# Patient Record
Sex: Female | Born: 2000 | Hispanic: No | Marital: Single | Smoking: Former smoker
Health system: Southern US, Community
[De-identification: ages and names within clinical notes are randomized; demographics above are authoritative.]

## PROBLEM LIST (undated history)

## (undated) DIAGNOSIS — F419 Anxiety disorder, unspecified: Secondary | ICD-10-CM

## (undated) DIAGNOSIS — F319 Bipolar disorder, unspecified: Secondary | ICD-10-CM

## (undated) DIAGNOSIS — F4310 Post-traumatic stress disorder, unspecified: Secondary | ICD-10-CM

## (undated) HISTORY — PX: WISDOM TOOTH EXTRACTION: SHX21

---
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
## 2021-01-14 ENCOUNTER — Other Ambulatory Visit: Payer: Self-pay

## 2021-01-14 ENCOUNTER — Encounter (HOSPITAL_COMMUNITY): Payer: Self-pay | Admitting: Emergency Medicine

## 2021-01-14 ENCOUNTER — Emergency Department (HOSPITAL_COMMUNITY)
Admission: EM | Admit: 2021-01-14 | Discharge: 2021-01-14 | Disposition: A | Discharge status: Home / Self Care | Payer: PRIVATE HEALTH INSURANCE | Attending: Emergency Medicine | Admitting: Emergency Medicine

## 2021-01-14 DIAGNOSIS — Z87891 Personal history of nicotine dependence: Secondary | ICD-10-CM | POA: Insufficient documentation

## 2021-01-14 DIAGNOSIS — W228XXA Striking against or struck by other objects, initial encounter: Secondary | ICD-10-CM | POA: Diagnosis not present

## 2021-01-14 DIAGNOSIS — S61411A Laceration without foreign body of right hand, initial encounter: Secondary | ICD-10-CM | POA: Diagnosis not present

## 2021-01-14 DIAGNOSIS — S6991XA Unspecified injury of right wrist, hand and finger(s), initial encounter: Secondary | ICD-10-CM | POA: Diagnosis present

## 2021-01-14 MED ORDER — AMOXICILLIN-POT CLAVULANATE 875-125 MG PO TABS: 1.0000 | ORAL_TABLET | Freq: Two times a day (BID) | ORAL | 0 refills | Status: DC
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
## 2021-01-14 NOTE — Discharge Instructions (Addendum)
Take antibiotic as prescribed Keep wound clean, apply over the counter antibiotic ointment twice daily Tetanus was updated today Return for signs of infection, increased pain redness warmth pus fevers
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
## 2021-06-20 ENCOUNTER — Encounter (HOSPITAL_COMMUNITY): Payer: Self-pay

## 2021-06-20 ENCOUNTER — Other Ambulatory Visit: Payer: Self-pay

## 2021-06-20 ENCOUNTER — Emergency Department (HOSPITAL_COMMUNITY)
Admission: EM | Admit: 2021-06-20 | Discharge: 2021-06-21 | Disposition: A | Discharge status: Home / Self Care | Payer: Medicaid - Out of State | Attending: Emergency Medicine | Admitting: Emergency Medicine

## 2021-06-20 DIAGNOSIS — S00531A Contusion of lip, initial encounter: Secondary | ICD-10-CM | POA: Insufficient documentation

## 2021-06-20 DIAGNOSIS — Y9289 Other specified places as the place of occurrence of the external cause: Secondary | ICD-10-CM | POA: Diagnosis not present

## 2021-06-20 DIAGNOSIS — W2209XA Striking against other stationary object, initial encounter: Secondary | ICD-10-CM | POA: Insufficient documentation

## 2021-06-20 DIAGNOSIS — S0101XA Laceration without foreign body of scalp, initial encounter: Secondary | ICD-10-CM | POA: Diagnosis not present

## 2021-06-20 DIAGNOSIS — S0012XA Contusion of left eyelid and periocular area, initial encounter: Secondary | ICD-10-CM | POA: Insufficient documentation

## 2021-06-20 DIAGNOSIS — Y9389 Activity, other specified: Secondary | ICD-10-CM | POA: Diagnosis not present

## 2021-06-20 DIAGNOSIS — S0990XA Unspecified injury of head, initial encounter: Secondary | ICD-10-CM | POA: Diagnosis present

## 2021-06-20 DIAGNOSIS — Z23 Encounter for immunization: Secondary | ICD-10-CM | POA: Insufficient documentation

## 2021-06-20 LAB — COMPREHENSIVE METABOLIC PANEL
ALT: 13 U/L (ref 0–44)
AST: 20 U/L (ref 15–41)
Albumin: 4.1 g/dL (ref 3.5–5.0)
Alkaline Phosphatase: 84 U/L (ref 38–126)
Anion gap: 8 (ref 5–15)
BUN: 13 mg/dL (ref 6–20)
CO2: 20 mmol/L — ABNORMAL LOW (ref 22–32)
Calcium: 9 mg/dL (ref 8.9–10.3)
Chloride: 108 mmol/L (ref 98–111)
Creatinine, Ser: 0.7 mg/dL (ref 0.44–1.00)
GFR, Estimated: >60 mL/min (ref >60)
Glucose, Bld: 95 mg/dL (ref 70–99)
Potassium: 3.6 mmol/L (ref 3.5–5.1)
Sodium: 136 mmol/L (ref 135–145)
Total Bilirubin: 0.7 mg/dL (ref 0.3–1.2)
Total Protein: 8.1 g/dL (ref 6.5–8.1)

## 2021-06-20 LAB — CBC WITH DIFFERENTIAL/PLATELET
Abs Immature Granulocytes: 0.05 10*3/uL (ref 0.00–0.07)
Basophils Absolute: 0 10*3/uL (ref 0.0–0.1)
Basophils Relative: 0 %
Eosinophils Absolute: 0.1 10*3/uL (ref 0.0–0.5)
Eosinophils Relative: 0 %
HCT: 39.4 % (ref 36.0–46.0)
Hemoglobin: 12.9 g/dL (ref 12.0–15.0)
Immature Granulocytes: 0 %
Lymphocytes Relative: 21 %
Lymphs Abs: 2.7 10*3/uL (ref 0.7–4.0)
MCH: 26.2 pg (ref 26.0–34.0)
MCHC: 32.7 g/dL (ref 30.0–36.0)
MCV: 80.1 fL (ref 80.0–100.0)
Monocytes Absolute: 0.9 10*3/uL (ref 0.1–1.0)
Monocytes Relative: 7 %
Neutro Abs: 9.1 10*3/uL — ABNORMAL HIGH (ref 1.7–7.7)
Neutrophils Relative %: 72 %
Platelets: 256 10*3/uL (ref 150–400)
RBC: 4.92 MIL/uL (ref 3.87–5.11)
RDW: 14.5 % (ref 11.5–15.5)
WBC: 12.9 10*3/uL — ABNORMAL HIGH (ref 4.0–10.5)
nRBC: 0 % (ref 0.0–0.2)
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
## 2021-06-21 ENCOUNTER — Other Ambulatory Visit: Payer: Self-pay

## 2021-06-21 ENCOUNTER — Emergency Department (HOSPITAL_BASED_OUTPATIENT_CLINIC_OR_DEPARTMENT_OTHER): Payer: Medicaid - Out of State

## 2021-06-21 ENCOUNTER — Encounter (HOSPITAL_BASED_OUTPATIENT_CLINIC_OR_DEPARTMENT_OTHER): Payer: Self-pay | Admitting: Emergency Medicine

## 2021-06-21 ENCOUNTER — Emergency Department (HOSPITAL_BASED_OUTPATIENT_CLINIC_OR_DEPARTMENT_OTHER): Payer: Medicaid - Out of State | Admitting: Radiology

## 2021-06-21 ENCOUNTER — Emergency Department (HOSPITAL_BASED_OUTPATIENT_CLINIC_OR_DEPARTMENT_OTHER)
Admission: EM | Admit: 2021-06-21 | Discharge: 2021-06-21 | Disposition: A | Discharge status: Home / Self Care | Payer: Medicaid - Out of State | Source: Home / Self Care | Attending: Emergency Medicine | Admitting: Emergency Medicine

## 2021-06-21 DIAGNOSIS — S0101XA Laceration without foreign body of scalp, initial encounter: Secondary | ICD-10-CM

## 2021-06-21 DIAGNOSIS — W228XXA Striking against or struck by other objects, initial encounter: Secondary | ICD-10-CM | POA: Insufficient documentation

## 2021-06-21 DIAGNOSIS — Y990 Civilian activity done for income or pay: Secondary | ICD-10-CM | POA: Insufficient documentation

## 2021-06-21 DIAGNOSIS — Z23 Encounter for immunization: Secondary | ICD-10-CM | POA: Insufficient documentation

## 2021-06-21 DIAGNOSIS — G930 Cerebral cysts: Secondary | ICD-10-CM

## 2021-06-21 DIAGNOSIS — S5002XA Contusion of left elbow, initial encounter: Secondary | ICD-10-CM

## 2021-06-21 DIAGNOSIS — Z87891 Personal history of nicotine dependence: Secondary | ICD-10-CM | POA: Insufficient documentation

## 2021-06-21 LAB — HCG, QUANTITATIVE, PREGNANCY: hCG, Beta Chain, Quant, S: <1 m[IU]/mL (ref <5)

## 2021-06-21 MED ORDER — TETANUS-DIPHTH-ACELL PERTUSSIS 5-2.5-18.5 LF-MCG/0.5 IM SUSY
0.5000 mL | PREFILLED_SYRINGE | Freq: Once | INTRAMUSCULAR | Status: AC
  Administered 2021-06-21: 0.5 mL via INTRAMUSCULAR
  Filled 2021-06-21: qty 0.5

## 2021-06-21 NOTE — ED Triage Notes (Addendum)
Reports head injury while working on car . Nausea and vomited last last night . Headache today , alert and oriented x 4 , steady gait  Adds cellulitis left elbow , redness , swelling , painful to touch
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
## 2021-06-21 NOTE — ED Notes (Signed)
No s/s of reaction to injection.  Pt stable for d/c.

## 2021-06-21 NOTE — Discharge Instructions (Addendum)
Keep wounds clean and dry. Follow-up with your primary care provider regarding incidental finding of cyst in your brain today.  If you do not have a primary care provider, you may contact neurosurgery to schedule follow-up.

## 2021-06-22 ENCOUNTER — Emergency Department (HOSPITAL_COMMUNITY): Payer: Medicaid - Out of State

## 2021-06-22 ENCOUNTER — Emergency Department (HOSPITAL_COMMUNITY)
Admission: EM | Admit: 2021-06-22 | Discharge: 2021-06-22 | Disposition: A | Discharge status: Home / Self Care | Payer: Medicaid - Out of State | Attending: Emergency Medicine | Admitting: Emergency Medicine

## 2021-06-22 DIAGNOSIS — Z87891 Personal history of nicotine dependence: Secondary | ICD-10-CM | POA: Diagnosis not present

## 2021-06-22 DIAGNOSIS — S0990XA Unspecified injury of head, initial encounter: Secondary | ICD-10-CM | POA: Diagnosis present

## 2021-06-22 DIAGNOSIS — W01198A Fall on same level from slipping, tripping and stumbling with subsequent striking against other object, initial encounter: Secondary | ICD-10-CM | POA: Insufficient documentation

## 2021-06-22 DIAGNOSIS — S0181XA Laceration without foreign body of other part of head, initial encounter: Secondary | ICD-10-CM | POA: Insufficient documentation

## 2021-06-22 MED ORDER — LIDOCAINE HCL (PF) 1 % IJ SOLN
5.0000 mL | Freq: Once | INTRAMUSCULAR | Status: AC
  Administered 2021-06-22: 5 mL
  Filled 2021-06-22: qty 5

## 2021-06-22 MED ORDER — ACETAMINOPHEN 325 MG PO TABS
650.0000 mg | ORAL_TABLET | Freq: Once | ORAL | Status: AC
  Administered 2021-06-22: 650 mg via ORAL
  Filled 2021-06-22: qty 2

## 2021-06-22 NOTE — ED Provider Notes (Signed)
Emergency Medicine Provider Triage Evaluation Note  Hannah Wilkinson , a 20 y.o. female  was evaluated in triage.  Pt complains of a forehead laceration that occurred 1 hour ago.  Patient was coming out of the shower when she slipped on the wet tile and fell forward striking her head against a broken towel rack.  She reports associated blurred vision initially which is since resolved.  Also complaining of a frontal headache which she rates moderate severity.  No loss of consciousness, focal weakness/numbness in her upper or lower extremities, trouble talking, trouble walking. Also had some nausea after the injury.  Review of Systems  Positive:  Negative: See above   Physical Exam  BP 113/90   Pulse 99   Temp 98.3 F (36.8 C)   Resp 14   LMP 05/31/2021 (Exact Date) Comment: pt denies chance of pregnancy  SpO2 99%  Gen:   Awake, no distress   Resp:  Normal effort  MSK:   Moves extremities without difficulty  Other:  4cm circular laceration to the forehead   Medical Decision Making  Medically screening exam initiated at 12:10 PM.  Appropriate orders placed.  Hannah Wilkinson was informed that the remainder of the evaluation will be completed by another provider, this initial triage assessment does not replace that evaluation, and the importance of remaining in the ED until their evaluation is complete.  Following [place] CT head rule.  Imaging not needed at this time.  Laceration will need to be repaired.   Honor Loh Fleetwood, PA-C 06/22/21 1213    Tegeler, Canary Brim, MD 06/22/21 [phone]
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
## 2021-06-22 NOTE — ED Notes (Signed)
Pt returned to lobby. Registration pulled back onto floor.
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
## 2021-06-22 NOTE — ED Triage Notes (Signed)
Pt reports slipping on water in the shower and falling, striking her head on the towel rack. Has lac to bridge of nose. No LOC. Reports lightheadedness at present. Last tetanus shot yesterday.

## 2021-06-22 NOTE — ED Notes (Signed)
Called pt X2 with no answer

## 2021-06-22 NOTE — Discharge Instructions (Addendum)
Warm soapy water over the wound.  Sutures need to be removed in 5 to 7 days  Follow up with primary care, urgent care or back here in the emergency

## 2021-07-07 ENCOUNTER — Emergency Department (HOSPITAL_COMMUNITY): Payer: Medicaid - Out of State

## 2021-07-07 ENCOUNTER — Other Ambulatory Visit: Payer: Self-pay

## 2021-07-07 ENCOUNTER — Emergency Department (HOSPITAL_COMMUNITY)
Admission: EM | Admit: 2021-07-07 | Discharge: 2021-07-07 | Disposition: A | Discharge status: Home / Self Care | Payer: Medicaid - Out of State | Attending: Emergency Medicine | Admitting: Emergency Medicine

## 2021-07-07 DIAGNOSIS — S70312A Abrasion, left thigh, initial encounter: Secondary | ICD-10-CM | POA: Diagnosis not present

## 2021-07-07 DIAGNOSIS — Z87891 Personal history of nicotine dependence: Secondary | ICD-10-CM | POA: Diagnosis not present

## 2021-07-07 DIAGNOSIS — M79605 Pain in left leg: Secondary | ICD-10-CM

## 2021-07-07 DIAGNOSIS — S60512A Abrasion of left hand, initial encounter: Secondary | ICD-10-CM | POA: Diagnosis not present

## 2021-07-07 DIAGNOSIS — Z20822 Contact with and (suspected) exposure to covid-19: Secondary | ICD-10-CM | POA: Diagnosis not present

## 2021-07-07 DIAGNOSIS — S40812A Abrasion of left upper arm, initial encounter: Secondary | ICD-10-CM | POA: Insufficient documentation

## 2021-07-07 DIAGNOSIS — M546 Pain in thoracic spine: Secondary | ICD-10-CM | POA: Insufficient documentation

## 2021-07-07 DIAGNOSIS — R1032 Left lower quadrant pain: Secondary | ICD-10-CM | POA: Insufficient documentation

## 2021-07-07 DIAGNOSIS — S40811A Abrasion of right upper arm, initial encounter: Secondary | ICD-10-CM | POA: Diagnosis not present

## 2021-07-07 DIAGNOSIS — S60511A Abrasion of right hand, initial encounter: Secondary | ICD-10-CM | POA: Insufficient documentation

## 2021-07-07 DIAGNOSIS — S0081XA Abrasion of other part of head, initial encounter: Secondary | ICD-10-CM | POA: Insufficient documentation

## 2021-07-07 DIAGNOSIS — S0990XA Unspecified injury of head, initial encounter: Secondary | ICD-10-CM

## 2021-07-07 DIAGNOSIS — Y92410 Unspecified street and highway as the place of occurrence of the external cause: Secondary | ICD-10-CM | POA: Diagnosis not present

## 2021-07-07 DIAGNOSIS — Y905 Blood alcohol level of 100-119 mg/100 ml: Secondary | ICD-10-CM | POA: Diagnosis not present

## 2021-07-07 LAB — POC URINE PREG, ED: Preg Test, Ur: NEGATIVE

## 2021-07-07 LAB — COMPREHENSIVE METABOLIC PANEL
ALT: 16 U/L (ref 0–44)
AST: 23 U/L (ref 15–41)
Albumin: 3.3 g/dL — ABNORMAL LOW (ref 3.5–5.0)
Alkaline Phosphatase: 86 U/L (ref 38–126)
Anion gap: 8 (ref 5–15)
BUN: 9 mg/dL (ref 6–20)
CO2: 22 mmol/L (ref 22–32)
Calcium: 8.7 mg/dL — ABNORMAL LOW (ref 8.9–10.3)
Chloride: 107 mmol/L (ref 98–111)
Creatinine, Ser: 0.76 mg/dL (ref 0.44–1.00)
GFR, Estimated: >60 mL/min (ref >60)
Glucose, Bld: 96 mg/dL (ref 70–99)
Potassium: 3.4 mmol/L — ABNORMAL LOW (ref 3.5–5.1)
Sodium: 137 mmol/L (ref 135–145)
Total Bilirubin: 0.3 mg/dL (ref 0.3–1.2)
Total Protein: 6.7 g/dL (ref 6.5–8.1)

## 2021-07-07 LAB — CBC
HCT: 37.6 % (ref 36.0–46.0)
Hemoglobin: 12.1 g/dL (ref 12.0–15.0)
MCH: 25.8 pg — ABNORMAL LOW (ref 26.0–34.0)
MCHC: 32.2 g/dL (ref 30.0–36.0)
MCV: 80.2 fL (ref 80.0–100.0)
Platelets: 253 10*3/uL (ref 150–400)
RBC: 4.69 MIL/uL (ref 3.87–5.11)
RDW: 14.7 % (ref 11.5–15.5)
WBC: 9.7 10*3/uL (ref 4.0–10.5)
nRBC: 0 % (ref 0.0–0.2)

## 2021-07-07 LAB — PROTIME-INR
INR: 0.9 (ref 0.8–1.2)
Prothrombin Time: 12.5 seconds (ref 11.4–15.2)

## 2021-07-07 LAB — SAMPLE TO BLOOD BANK

## 2021-07-07 LAB — RESP PANEL BY RT-PCR (FLU A&B, COVID) ARPGX2
Influenza A by PCR: NEGATIVE
Influenza B by PCR: NEGATIVE
SARS Coronavirus 2 by RT PCR: NEGATIVE

## 2021-07-07 LAB — ETHANOL: Alcohol, Ethyl (B): 114 mg/dL — ABNORMAL HIGH (ref <10)

## 2021-07-07 MED ORDER — ACETAMINOPHEN 500 MG PO TABS
1000.0000 mg | ORAL_TABLET | Freq: Once | ORAL | Status: AC
  Administered 2021-07-07: 1000 mg via ORAL
  Filled 2021-07-07: qty 2

## 2021-07-07 MED ORDER — SODIUM CHLORIDE 0.9 % IV BOLUS
1000.0000 mL | Freq: Once | INTRAVENOUS | Status: AC
  Administered 2021-07-07: 1000 mL via INTRAVENOUS

## 2021-07-07 MED ORDER — IOHEXOL 300 MG/ML  SOLN
80.0000 mL | Freq: Once | INTRAMUSCULAR | Status: AC | PRN
  Administered 2021-07-07: 80 mL via INTRAVENOUS

## 2021-07-07 NOTE — ED Notes (Signed)
Transported to CT 

## 2021-07-07 NOTE — Discharge Instructions (Signed)
You were evaluated in the Emergency Department and after careful evaluation, we did not find any emergent condition requiring admission or further testing in the hospital.  Your exam/testing today was overall reassuring.  Imaging does not show any significant injuries.  Recommend Tylenol or Motrin at home for discomfort.  Please return to the Emergency Department if you experience any worsening of your condition.  Thank you for allowing [place] to be a part of your care.

## 2021-07-07 NOTE — ED Notes (Signed)
Patient transported to CT 
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
## 2022-01-20 ENCOUNTER — Emergency Department: Payer: 59

## 2022-01-20 ENCOUNTER — Emergency Department
Admission: EM | Admit: 2022-01-20 | Discharge: 2022-01-20 | Disposition: A | Discharge status: Home / Self Care | Payer: 59 | Attending: Emergency Medicine | Admitting: Emergency Medicine

## 2022-01-20 ENCOUNTER — Other Ambulatory Visit: Payer: Self-pay

## 2022-01-20 DIAGNOSIS — S0990XA Unspecified injury of head, initial encounter: Secondary | ICD-10-CM

## 2022-01-20 DIAGNOSIS — Z23 Encounter for immunization: Secondary | ICD-10-CM | POA: Insufficient documentation

## 2022-01-20 DIAGNOSIS — S0181XA Laceration without foreign body of other part of head, initial encounter: Secondary | ICD-10-CM

## 2022-01-20 DIAGNOSIS — Y9301 Activity, walking, marching and hiking: Secondary | ICD-10-CM | POA: Insufficient documentation

## 2022-01-20 DIAGNOSIS — W260XXA Contact with knife, initial encounter: Secondary | ICD-10-CM | POA: Insufficient documentation

## 2022-01-20 MED ORDER — LIDOCAINE-EPINEPHRINE 2 %-1:100000 IJ SOLN
20.0000 mL | Freq: Once | INTRAMUSCULAR | Status: AC
  Administered 2022-01-20: 20 mL

## 2022-01-20 MED ORDER — TETANUS-DIPHTH-ACELL PERTUSSIS 5-2.5-18.5 LF-MCG/0.5 IM SUSY
0.5000 mL | PREFILLED_SYRINGE | Freq: Once | INTRAMUSCULAR | Status: AC
  Administered 2022-01-20: 0.5 mL via INTRAMUSCULAR
  Filled 2022-01-20: qty 0.5
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
## 2022-01-20 NOTE — ED Notes (Signed)
Pt sitting up in bed, on cell phone at this time.  Pt denies any needs at this time.  NAD noted.   ?

## 2022-01-20 NOTE — ED Notes (Signed)
Dr. Elesa Massed at bedside suturing laceration to left forehead.   ?

## 2022-01-20 NOTE — Discharge Instructions (Signed)
You may alternate Tylenol 1000 mg every 6 hours as needed for pain, fever and Ibuprofen 800 mg every 6-8 hours as needed for pain, fever.  Please take Ibuprofen with food.  Do not take more than 4000 mg of Tylenol (acetaminophen) in a 24 hour period.  Steps to find a Primary Care Provider (PCP):  Call [phone] or [phone] to access "[facility] Find a Doctor Service."  2.  You may also go on the [facility] website at [email or web address]  

## 2022-01-20 NOTE — ED Notes (Signed)
Pt ambulatory to lobby at this time.  Pt in NAD.  Given DC instructions and pt verbalized understanding.  ?

## 2022-01-29 ENCOUNTER — Other Ambulatory Visit: Payer: Self-pay

## 2022-01-29 ENCOUNTER — Emergency Department: Payer: 59

## 2022-01-29 ENCOUNTER — Encounter: Payer: Self-pay | Admitting: Emergency Medicine

## 2022-01-29 DIAGNOSIS — R0981 Nasal congestion: Secondary | ICD-10-CM | POA: Insufficient documentation

## 2022-01-29 DIAGNOSIS — R059 Cough, unspecified: Secondary | ICD-10-CM | POA: Insufficient documentation

## 2022-01-29 DIAGNOSIS — Z20822 Contact with and (suspected) exposure to covid-19: Secondary | ICD-10-CM | POA: Insufficient documentation

## 2022-01-29 DIAGNOSIS — H6121 Impacted cerumen, right ear: Secondary | ICD-10-CM | POA: Insufficient documentation

## 2022-01-29 DIAGNOSIS — Z5321 Procedure and treatment not carried out due to patient leaving prior to being seen by health care provider: Secondary | ICD-10-CM | POA: Insufficient documentation

## 2022-01-29 DIAGNOSIS — M791 Myalgia, unspecified site: Secondary | ICD-10-CM | POA: Insufficient documentation

## 2022-01-29 DIAGNOSIS — J029 Acute pharyngitis, unspecified: Secondary | ICD-10-CM | POA: Insufficient documentation

## 2022-01-29 LAB — RESP PANEL BY RT-PCR (FLU A&B, COVID) ARPGX2
Influenza A by PCR: NEGATIVE
Influenza B by PCR: NEGATIVE
SARS Coronavirus 2 by RT PCR: NEGATIVE

## 2022-01-29 LAB — GROUP A STREP BY PCR: Group A Strep by PCR: NOT DETECTED

## 2022-01-29 NOTE — ED Provider Triage Note (Signed)
Emergency Medicine Provider Triage Evaluation Note  Hannah Wilkinson , a 21 y.o. female  was evaluated in triage.  Pt complains of body aches, congestion, sore throat, cough times a week..  Review of Systems  Positive: Bodies, congestion, sore throat, cough Negative: Fever, chills, shortness of breath, GI symptoms  Physical Exam  LMP 01/06/2022 (Approximate)  Gen:   Awake, no distress   Resp:  Normal effort  MSK:   Moves extremities without difficulty  Other:  Exudates on tonsils  Medical Decision Making  Medically screening exam initiated at 7:46 PM.  Appropriate orders placed.  Shanya Ferriss was informed that the remainder of the evaluation will be completed by another provider, this initial triage assessment does not replace that evaluation, and the importance of remaining in the ED until their evaluation is complete.  Patient presents to the ED complaining of congestion, cough, body aches times a week.  Will order COVID, flu, strep and chest x-ray.   Racheal Patches, PA-C 01/29/22 1946

## 2022-01-29 NOTE — ED Triage Notes (Signed)
Pt presents via POV with c/o generalized body aches, sore throat and right ear fullness. Pt reports has been going on since last Saturday. Denies any known sick contacts

## 2022-01-30 ENCOUNTER — Encounter: Payer: Self-pay | Admitting: Emergency Medicine

## 2022-01-30 ENCOUNTER — Emergency Department
Admission: EM | Admit: 2022-01-30 | Discharge: 2022-01-30 | Discharge status: Against Medical Advice | Payer: 59 | Attending: Emergency Medicine | Admitting: Emergency Medicine

## 2022-01-30 ENCOUNTER — Ambulatory Visit
Admission: EM | Admit: 2022-01-30 | Discharge: 2022-01-30 | Disposition: A | Discharge status: Home / Self Care | Payer: 59 | Attending: Family Medicine | Admitting: Family Medicine

## 2022-01-30 DIAGNOSIS — Z4802 Encounter for removal of sutures: Secondary | ICD-10-CM

## 2022-01-30 DIAGNOSIS — H9201 Otalgia, right ear: Secondary | ICD-10-CM

## 2022-01-30 DIAGNOSIS — J039 Acute tonsillitis, unspecified: Secondary | ICD-10-CM

## 2022-01-30 LAB — POCT RAPID STREP A (OFFICE): Rapid Strep A Screen: NEGATIVE

## 2022-01-30 MED ORDER — AMOXICILLIN-POT CLAVULANATE 875-125 MG PO TABS: 1.0000 | ORAL_TABLET | Freq: Two times a day (BID) | ORAL | 0 refills | Status: AC

## 2022-01-30 NOTE — ED Notes (Signed)
No answer when called several times from lobby 
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
## 2022-01-30 NOTE — ED Triage Notes (Addendum)
Pt presents with a ST x 1 week and right ear pain. It appears she went to the ED yesterday but left without being seen. She also needs her sutures removed that were placed on 01/20/22 at [facility].

## 2022-04-29 ENCOUNTER — Ambulatory Visit
Admission: EM | Admit: 2022-04-29 | Discharge: 2022-04-29 | Disposition: A | Discharge status: Home / Self Care | Payer: Self-pay

## 2022-04-29 DIAGNOSIS — S61411A Laceration without foreign body of right hand, initial encounter: Secondary | ICD-10-CM
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
## 2022-04-29 NOTE — Discharge Instructions (Signed)
Patient treated for laceration to right hand  Externally as it has been 2 days we will be unable to close the area, on exam your skin is already begun to heal and will continue to do so over the next few days  As you endorsed that you have seen puslike drainage at home we will provide antibiotics to remove any infection  Take doxycycline every morning and every evening for the next 7 days  Your wound has been cleansed here in the urgent care, once home you may cleanse daily with normal soap and water, pat dry and then cover with a Band-Aid for the next 7 days  Follow-up with urgent care as needed for any further concerns

## 2022-05-10 ENCOUNTER — Other Ambulatory Visit: Payer: Self-pay

## 2022-05-10 ENCOUNTER — Emergency Department: Payer: Medicaid - Out of State

## 2022-05-10 ENCOUNTER — Emergency Department
Admission: EM | Admit: 2022-05-10 | Discharge: 2022-05-10 | Disposition: A | Discharge status: Home / Self Care | Payer: Medicaid - Out of State | Attending: Emergency Medicine | Admitting: Emergency Medicine

## 2022-05-10 DIAGNOSIS — S0181XA Laceration without foreign body of other part of head, initial encounter: Secondary | ICD-10-CM

## 2022-05-10 DIAGNOSIS — X58XXXA Exposure to other specified factors, initial encounter: Secondary | ICD-10-CM | POA: Insufficient documentation

## 2022-05-10 DIAGNOSIS — S0993XA Unspecified injury of face, initial encounter: Secondary | ICD-10-CM | POA: Diagnosis present

## 2022-05-10 DIAGNOSIS — S01411A Laceration without foreign body of right cheek and temporomandibular area, initial encounter: Secondary | ICD-10-CM | POA: Diagnosis not present

## 2022-05-10 MED ORDER — LIDOCAINE HCL (PF) 1 % IJ SOLN
INTRAMUSCULAR | Status: AC
  Administered 2022-05-10: 5 mL
  Filled 2022-05-10: qty 5

## 2022-05-10 MED ORDER — CEPHALEXIN 500 MG PO CAPS: 500.0000 mg | ORAL_CAPSULE | Freq: Three times a day (TID) | ORAL | 0 refills | Status: AC

## 2022-05-10 MED ORDER — LIDOCAINE HCL 1 % IJ SOLN: 5.0000 mL | Freq: Once | INTRAMUSCULAR | Status: AC

## 2022-05-10 NOTE — ED Provider Notes (Signed)
[facility] Provider Note  Patient Contact: 10:55 PM (approximate)   History   Laceration   HPI  Hannah Wilkinson is a 21 y.o. female presents to the emergency department with a 6 cm linear right-sided facial laceration that was sustained from working on patient's car.  Patient states that her tetanus status is up-to-date.  Patient denies numbness or tingling in the upper and lower extremities.  No chest pain, chest tightness or abdominal pain.      Physical Exam   Triage Vital Signs: ED Triage Vitals  Enc Vitals Group     BP 05/10/22 2232 128/87     Pulse Rate 05/10/22 2232 93     Resp 05/10/22 2232 18     Temp --      Temp src --      SpO2 05/10/22 2232 98 %     Weight 05/10/22 1958 280 lb (127 kg)     Height 05/10/22 1958 5\' 4"  (1.626 m)     Head Circumference --      Peak Flow --      Pain Score 05/10/22 1958 5     Pain Loc --      Pain Edu? --      Excl. in GC? --     Most recent vital signs: Vitals:   05/10/22 2232  BP: 128/87  Pulse: 93  Resp: 18  SpO2: 98%     General: Alert and in no acute distress. Eyes:  PERRL. EOMI. Head: No acute traumatic findings ENT:      Nose: No congestion/rhinnorhea.      Mouth/Throat: Mucous membranes are moist. Neck: No stridor. No cervical spine tenderness to palpation. Cardiovascular:  Good peripheral perfusion Respiratory: Normal respiratory effort without tachypnea or retractions. Lungs CTAB. Good air entry to the bases with no decreased or absent breath sounds. Gastrointestinal: Bowel sounds 4 quadrants. Soft and nontender to palpation. No guarding or rigidity. No palpable masses. No distention. No CVA tenderness. Musculoskeletal: Full range of motion to all extremities.  Neurologic:  No gross focal neurologic deficits are appreciated.  Skin: Patient has a 6 cm linear facial laceration deep to underlying adipose tissue. Other:   ED Results / Procedures / Treatments   Labs (all labs  ordered are listed, but only abnormal results are displayed) Labs Reviewed - No data to display      RADIOLOGY  I personally viewed and evaluated these images as part of my medical decision making, as well as reviewing the written report by the radiologist.  ED Provider Interpretation: CT head and CT max face unremarkable.   PROCEDURES:  Critical Care performed: No  ..Laceration Repair  Date/Time: 05/10/2022 10:59 PM  Performed by: 05/12/2022, PA-C Authorized by: Orvil Feil, PA-C   Consent:    Consent obtained:  Verbal   Risks discussed:  Infection and pain Universal protocol:    Procedure explained and questions answered to patient or proxy's satisfaction: yes     Patient identity confirmed:  Verbally with patient Anesthesia:    Anesthesia method:  Local infiltration   Local anesthetic:  Lidocaine 1% w/o epi Laceration details:    Location:  Face   Face location:  R cheek   Length (cm):  6   Depth (mm):  5 Pre-procedure details:    Preparation:  Patient was prepped and draped in usual sterile fashion Exploration:    Limited defect created (wound extended): no     Contaminated: no  Treatment:    Area cleansed with:  Povidone-iodine   Amount of cleaning:  Standard   Visualized foreign bodies/material removed: no     Debridement:  None Skin repair:    Repair method:  Sutures   Suture size:  5-0   Suture technique:  Running   Number of sutures:  10 Approximation:    Approximation:  Close Repair type:    Repair type:  Simple Post-procedure details:    Dressing:  Non-adherent dressing    MEDICATIONS ORDERED IN ED: Medications  lidocaine (XYLOCAINE) 1 % (with pres) injection 5 mL (5 mLs Infiltration Given 05/10/22 2100)     IMPRESSION / MDM / ASSESSMENT AND PLAN / ED COURSE  I reviewed the triage vital signs and the nursing notes.                              Assessment and plan Laceration 21 year old female presents to the emergency  department with a 6 cm laceration along right cheek.  Vital signs are reassuring at triage.  On exam, patient was alert, active and nontoxic-appearing.  CT head and CT max face showed no evidence of facial fractures, skull fracture or intracranial bleed.  Laceration was repaired easily without complication.  Recommended suture removal in 5 days.  Patient was discharged with Keflex.  Her tetanus status was up-to-date.  Clinical Course as of 05/10/22 2255  Thu May 10, 2022  2218 CT Head Wo Contrast [JW]    Clinical Course User Index [JW] Orvil Feil, [place]     FINAL CLINICAL IMPRESSION(S) / ED DIAGNOSES   Final diagnoses:  Facial laceration, initial encounter     Rx / DC Orders   ED Discharge Orders          Ordered    cephALEXin (KEFLEX) 500 MG capsule  3 times daily        05/10/22 2220             Note:  This document was prepared using Dragon voice recognition software and may include unintentional dictation errors.   Pia Mau [place], PA-C 05/10/22 1610    Sharyn Creamer, MD 05/11/22 [phone]

## 2022-05-10 NOTE — Discharge Instructions (Addendum)
Take Keflex three times daily for the next seven days.  °Have sutures removed in seven days.  °

## 2022-05-10 NOTE — ED Notes (Signed)
E signature pad not working. Pt educated on discharge instructions and verbalized understanding.  

## 2022-05-10 NOTE — ED Triage Notes (Signed)
Pt arrives with c/o laceration to right side of her face. Bleeding is controlled at this time.

## 2022-05-15 ENCOUNTER — Ambulatory Visit
Admission: EM | Admit: 2022-05-15 | Discharge: 2022-05-15 | Disposition: A | Discharge status: Home / Self Care | Payer: Medicaid - Out of State | Attending: Emergency Medicine | Admitting: Emergency Medicine

## 2022-05-15 DIAGNOSIS — U071 COVID-19: Secondary | ICD-10-CM | POA: Insufficient documentation

## 2022-05-15 DIAGNOSIS — R0981 Nasal congestion: Secondary | ICD-10-CM | POA: Insufficient documentation

## 2022-05-15 LAB — SARS CORONAVIRUS 2 (TAT 6-24 HRS): SARS Coronavirus 2: POSITIVE — AB

## 2022-05-15 NOTE — ED Triage Notes (Signed)
Patient to Urgent Care with complaints of congestion and body aches that started yesterday. Also reports a dry throat.  Reports having a positive covid test at home today.

## 2022-05-15 NOTE — Discharge Instructions (Addendum)
The PCR COVID test is pending.  You should self-quarantine according to the CDC guidelines.      Most people do not need to be re-tested at the end of the quarantine period.    Go to the emergency department if you have high fever, shortness of breath, severe diarrhea, or other concerning symptoms.

## 2022-05-15 NOTE — ED Provider Notes (Signed)
[name]    CSN: [id number] Arrival date & time: 05/15/22  1206      History   Chief Complaint Chief Complaint  Patient presents with   Nasal Congestion    HPI Hannah Wilkinson is a 21 y.o. female.  Patient tested positive for COVID at home today. Symptom onset yesterday; fever, body aches, congestion, sore throat, cough.  No rash, shortness of breath, vomiting, diarrhea, or other symptoms.  Treatment at home with ibuprofen and Mucinex.  Patient requests a PCR COVID test.  No pertinent medical history.  BMI 48.  She denies current pregnancy or breastfeeding.      The history is provided by the patient and medical records.    History reviewed. No pertinent past medical history.  There are no problems to display for this patient.   Past Surgical History:  Procedure Laterality Date   WISDOM TOOTH EXTRACTION      OB History   No obstetric history on file.      Home Medications    Prior to Admission medications   Medication Sig Start Date End Date Taking? Authorizing Provider  cephALEXin (KEFLEX) 500 MG capsule Take 1 capsule (500 mg total) by mouth 3 (three) times daily for 7 days. 05/10/22 05/17/22  Orvil Feil, PA-C    Family History History reviewed. No pertinent family history.  Social History Social History   Tobacco Use   Smoking status: Former   Smokeless tobacco: Former  Building services engineer Use: Every day  Substance Use Topics   Alcohol use: Yes    Comment: social   Drug use: Never     Allergies   Patient has no known allergies.   Review of Systems Review of Systems  Constitutional:  Positive for fever. Negative for chills.  HENT:  Positive for congestion, rhinorrhea and sore throat. Negative for ear pain.   Respiratory:  Positive for cough. Negative for shortness of breath.   Cardiovascular:  Negative for chest pain and palpitations.  Gastrointestinal:  Negative for diarrhea and vomiting.  Skin:  Negative for color change and  rash.  All other systems reviewed and are negative.    Physical Exam Triage Vital Signs ED Triage Vitals  Enc Vitals Group     BP      Pulse      Resp      Temp      Temp src      SpO2      Weight      Height      Head Circumference      Peak Flow      Pain Score      Pain Loc      Pain Edu?      Excl. in GC?    No data found.  Updated Vital Signs BP (!) 141/93   Pulse (!) 104   Temp 97.9 F (36.6 C)   Resp 18   Ht 5\' 4"  (1.626 m)   Wt 280 lb (127 kg)   LMP 05/10/2022   SpO2 97%   BMI 48.06 kg/m   Visual Acuity Right Eye Distance:   Left Eye Distance:   Bilateral Distance:    Right Eye Near:   Left Eye Near:    Bilateral Near:     Physical Exam Vitals and nursing note reviewed.  Constitutional:      General: She is not in acute distress.    Appearance: She is well-developed. She is obese.  She is not ill-appearing.  HENT:     Right Ear: Tympanic membrane normal.     Left Ear: Tympanic membrane normal.     Nose: Nose normal.     Mouth/Throat:     Mouth: Mucous membranes are moist.     Pharynx: Oropharynx is clear.  Cardiovascular:     Rate and Rhythm: Normal rate and regular rhythm.     Heart sounds: Normal heart sounds.  Pulmonary:     Effort: Pulmonary effort is normal. No respiratory distress.     Breath sounds: Normal breath sounds.  Musculoskeletal:     Cervical back: Neck supple.  Skin:    General: Skin is warm and dry.  Neurological:     Mental Status: She is alert.  Psychiatric:        Mood and Affect: Mood normal.        Behavior: Behavior normal.      UC Treatments / Results  Labs (all labs ordered are listed, but only abnormal results are displayed) Labs Reviewed  SARS CORONAVIRUS 2 (TAT 6-24 HRS)  POCT URINE PREGNANCY    EKG   Radiology No results found.  Procedures Procedures (including critical care time)  Medications Ordered in UC Medications - No data to display  Initial Impression / Assessment and Plan /  UC Course  I have reviewed the triage vital signs and the nursing notes.  Pertinent labs & imaging results that were available during my care of the patient were reviewed by me and considered in my medical decision making (see chart for details).    COVID-19.  Patient tested positive for COVID at home today.  Symptom onset yesterday.  BMI 48.  She declines antiviral medication.  Discussed symptomatic care including Tylenol or ibuprofen, rest, hydration.  ED precautions discussed.  Education provided on [employer] guidelines and on viral illness.  Instructed patient to follow up with her PCP if her symptoms are not improving.  She agrees to plan of care.    Final Clinical Impressions(s) / UC Diagnoses   Final diagnoses:  Nasal congestion  COVID-19     Discharge Instructions      The PCR COVID test is pending.  You should self-quarantine according to the CDC guidelines.      Most people do not need to be re-tested at the end of the quarantine period.    Go to the emergency department if you have high fever, shortness of breath, severe diarrhea, or other concerning symptoms.        ED Prescriptions   None    PDMP not reviewed this encounter.   Mickie Bail, NP 05/15/22 1245

## 2022-07-11 ENCOUNTER — Emergency Department
Admission: EM | Admit: 2022-07-11 | Discharge: 2022-07-11 | Disposition: A | Discharge status: Home / Self Care | Payer: Medicaid - Out of State | Attending: Emergency Medicine | Admitting: Emergency Medicine

## 2022-07-11 ENCOUNTER — Emergency Department: Payer: Medicaid - Out of State

## 2022-07-11 ENCOUNTER — Encounter: Payer: Self-pay | Admitting: Emergency Medicine

## 2022-07-11 DIAGNOSIS — W01198A Fall on same level from slipping, tripping and stumbling with subsequent striking against other object, initial encounter: Secondary | ICD-10-CM | POA: Insufficient documentation

## 2022-07-11 DIAGNOSIS — S0181XA Laceration without foreign body of other part of head, initial encounter: Secondary | ICD-10-CM | POA: Diagnosis not present

## 2022-07-11 DIAGNOSIS — S0993XA Unspecified injury of face, initial encounter: Secondary | ICD-10-CM | POA: Diagnosis present

## 2022-07-11 MED ORDER — AMOXICILLIN-POT CLAVULANATE 875-125 MG PO TABS
1.0000 | ORAL_TABLET | Freq: Two times a day (BID) | ORAL | 0 refills | Status: AC
Start: 2022-07-11 — End: 2022-07-18

## 2022-07-11 MED ORDER — LIDOCAINE-EPINEPHRINE 1 %-1:100000 IJ SOLN
10.0000 mL | Freq: Once | INTRAMUSCULAR | Status: AC
  Administered 2022-07-11: 10 mL
  Filled 2022-07-11: qty 1

## 2022-07-11 NOTE — Discharge Instructions (Signed)
Return to the ER for any increasing pain swelling warmth redness or fevers, headache, vision changes, nausea vomiting or any urgent changes in your health.  Keep laceration site clean and dry for 5 days.  In 5 days you can shower and get wet and allow Dermabond to come off on its own.  Take antibiotic as prescribed.

## 2022-07-11 NOTE — ED Notes (Signed)
This RN over hears loud verbal dispute while I am taking care of another patient in the next room.  Upon finishing up with previous patient, this RN into room, asked patient and visitors if everything was ok and to please be courteous of others as we do not need to hear their personal dispute.  Advised that if this continued to be an issue, both visitors would be escorted out.  Patient apologizes and expresses verbal understanding at this time.  Security notified of potential need for assistance.
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
## 2022-07-23 ENCOUNTER — Ambulatory Visit
Admission: EM | Admit: 2022-07-23 | Discharge: 2022-07-23 | Disposition: A | Discharge status: Home / Self Care | Payer: Medicaid - Out of State | Attending: Emergency Medicine | Admitting: Emergency Medicine

## 2022-07-23 DIAGNOSIS — S0181XD Laceration without foreign body of other part of head, subsequent encounter: Secondary | ICD-10-CM | POA: Diagnosis not present

## 2022-07-23 DIAGNOSIS — Z5189 Encounter for other specified aftercare: Secondary | ICD-10-CM

## 2022-07-23 MED ORDER — CEPHALEXIN 500 MG PO CAPS: 500.0000 mg | ORAL_CAPSULE | Freq: Four times a day (QID) | ORAL | 0 refills | Status: DC

## 2022-07-23 NOTE — Discharge Instructions (Addendum)
Take the cephalexin as directed.  Follow up with your primary care provider if your symptoms are not improving.

## 2022-07-23 NOTE — ED Triage Notes (Addendum)
Patient to Urgent Care with request to have the wound on the left side of her face evaluated.   Patient had 6cm laceration present to left cheek- sutures and dermabond applied 07/11/22.

## 2022-07-23 NOTE — ED Provider Notes (Signed)
[name]    CSN: [id number] Arrival date & time: 07/23/22  1832      History   Chief Complaint Chief Complaint  Patient presents with   Wound Check    HPI Hannah Wilkinson is a 21 y.o. female.  Patient presents for a recheck of the wound on her left cheek.  No fever, chills, wound drainage, or other symptoms.  She was seen at [facility] ED on 07/11/2022 for a facial laceration; CT was negative; tetanus up-to-date; wound closed with dissolvable suture and Dermabond; treated prophylactically with Augmentin; Patient did not take the Augmentin.   The history is provided by the patient and medical records.    History reviewed. No pertinent past medical history.  There are no problems to display for this patient.   Past Surgical History:  Procedure Laterality Date   WISDOM TOOTH EXTRACTION      OB History   No obstetric history on file.      Home Medications    Prior to Admission medications   Medication Sig Start Date End Date Taking? Authorizing Provider  cephALEXin (KEFLEX) 500 MG capsule Take 1 capsule (500 mg total) by mouth 4 (four) times daily. 07/23/22  Yes Mickie Bail, NP    Family History History reviewed. No pertinent family history.  Social History Social History   Tobacco Use   Smoking status: Former   Smokeless tobacco: Former  Building services engineer Use: Every day  Substance Use Topics   Alcohol use: Yes    Comment: social   Drug use: Never     Allergies   Patient has no known allergies.   Review of Systems Review of Systems  Constitutional:  Negative for chills and fever.  Skin:  Positive for wound. Negative for color change.  All other systems reviewed and are negative.    Physical Exam Triage Vital Signs ED Triage Vitals  Enc Vitals Group     BP 07/23/22 1913 115/84     Pulse Rate 07/23/22 1912 (!) 113     Resp 07/23/22 1912 18     Temp 07/23/22 1912 98.2 F (36.8 C)     Temp src --      SpO2 07/23/22 1912 96 %      Weight --      Height --      Head Circumference --      Peak Flow --      Pain Score 07/23/22 1910 0     Pain Loc --      Pain Edu? --      Excl. in GC? --    No data found.  Updated Vital Signs BP 115/84   Pulse (!) 113   Temp 98.2 F (36.8 C)   Resp 18   LMP 06/22/2022   SpO2 96%   Visual Acuity Right Eye Distance:   Left Eye Distance:   Bilateral Distance:    Right Eye Near:   Left Eye Near:    Bilateral Near:     Physical Exam Vitals and nursing note reviewed.  Constitutional:      General: She is not in acute distress.    Appearance: She is well-developed. She is not ill-appearing.  HENT:     Mouth/Throat:     Mouth: Mucous membranes are moist.  Cardiovascular:     Rate and Rhythm: Normal rate and regular rhythm.     Heart sounds: Normal heart sounds.  Pulmonary:     Effort:  Pulmonary effort is normal. No respiratory distress.     Breath sounds: Normal breath sounds.  Musculoskeletal:     Cervical back: Neck supple.  Skin:    General: Skin is warm and dry.     Findings: Lesion present.  Neurological:     General: No focal deficit present.     Mental Status: She is alert and oriented to person, place, and time.     Sensory: No sensory deficit.     Motor: No weakness.  Psychiatric:        Mood and Affect: Mood normal.        Behavior: Behavior normal.      UC Treatments / Results  Labs (all labs ordered are listed, but only abnormal results are displayed) Labs Reviewed - No data to display  EKG   Radiology No results found.  Procedures Procedures (including critical care time)  Medications Ordered in UC Medications - No data to display  Initial Impression / Assessment and Plan / UC Course  I have reviewed the triage vital signs and the nursing notes.  Pertinent labs & imaging results that were available during my care of the patient were reviewed by me and considered in my medical decision making (see chart for details).   Wound  recheck, subsequent encounter for facial laceration.  Patient was prescribed Augmentin in the ED but did not take it.  Today with Keflex.  Wound care instructions and signs of infection discussed.  Instructed patient to follow-up with her PCP.  Education provided on laceration care.  Patient agrees plan of care.  Final Clinical Impressions(s) / UC Diagnoses   Final diagnoses:  Encounter for wound re-check  Facial laceration, subsequent encounter     Discharge Instructions      Take the cephalexin as directed.  Follow up with your primary care provider if your symptoms are not improving.        ED Prescriptions     Medication Sig Dispense Auth. Provider   cephALEXin (KEFLEX) 500 MG capsule Take 1 capsule (500 mg total) by mouth 4 (four) times daily. 28 capsule Mickie Bail, NP      PDMP not reviewed this encounter.   Mickie Bail, NP 07/23/22 1942
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
## 2023-04-03 ENCOUNTER — Emergency Department (HOSPITAL_COMMUNITY)
Admission: EM | Admit: 2023-04-03 | Discharge: 2023-04-04 | Discharge status: Against Medical Advice | Payer: MEDICAID | Attending: Emergency Medicine | Admitting: Emergency Medicine

## 2023-04-03 ENCOUNTER — Emergency Department (HOSPITAL_COMMUNITY): Payer: MEDICAID

## 2023-04-03 DIAGNOSIS — Z5321 Procedure and treatment not carried out due to patient leaving prior to being seen by health care provider: Secondary | ICD-10-CM | POA: Insufficient documentation

## 2023-04-03 DIAGNOSIS — S61552A Open bite of left wrist, initial encounter: Secondary | ICD-10-CM | POA: Insufficient documentation

## 2023-04-03 DIAGNOSIS — W540XXA Bitten by dog, initial encounter: Secondary | ICD-10-CM | POA: Diagnosis not present
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
## 2023-04-04 NOTE — ED Notes (Signed)
Pt called for in ED lobby for room assignment- no answer x1. ENMiles 

## 2023-06-11 ENCOUNTER — Emergency Department (HOSPITAL_COMMUNITY)
Admission: EM | Admit: 2023-06-11 | Discharge: 2023-06-11 | Disposition: A | Discharge status: Home / Self Care | Payer: MEDICAID | Attending: Emergency Medicine | Admitting: Emergency Medicine

## 2023-06-11 ENCOUNTER — Emergency Department (HOSPITAL_COMMUNITY)
Admission: EM | Admit: 2023-06-11 | Discharge: 2023-06-11 | Disposition: A | Discharge status: Home / Self Care | Payer: MEDICAID | Source: Home / Self Care | Attending: Emergency Medicine | Admitting: Emergency Medicine

## 2023-06-11 ENCOUNTER — Emergency Department (HOSPITAL_COMMUNITY): Payer: MEDICAID

## 2023-06-11 DIAGNOSIS — S060X0D Concussion without loss of consciousness, subsequent encounter: Secondary | ICD-10-CM | POA: Insufficient documentation

## 2023-06-11 DIAGNOSIS — S0990XA Unspecified injury of head, initial encounter: Secondary | ICD-10-CM | POA: Diagnosis present

## 2023-06-11 DIAGNOSIS — S060X0A Concussion without loss of consciousness, initial encounter: Secondary | ICD-10-CM | POA: Diagnosis not present

## 2023-06-11 DIAGNOSIS — W228XXA Striking against or struck by other objects, initial encounter: Secondary | ICD-10-CM | POA: Insufficient documentation

## 2023-06-11 MED ORDER — ONDANSETRON 4 MG PO TBDP
4.0000 mg | ORAL_TABLET | Freq: Once | ORAL | Status: AC
  Administered 2023-06-11: 4 mg via ORAL
  Filled 2023-06-11: qty 1

## 2023-06-11 MED ORDER — ACETAMINOPHEN 500 MG PO TABS
500.0000 mg | ORAL_TABLET | Freq: Four times a day (QID) | ORAL | 0 refills | Status: AC | PRN
Start: 2023-06-11 — End: ?

## 2023-06-11 MED ORDER — ONDANSETRON HCL 4 MG PO TABS
4.0000 mg | ORAL_TABLET | Freq: Three times a day (TID) | ORAL | 0 refills | Status: AC | PRN
Start: 2023-06-11 — End: ?

## 2023-06-11 MED ORDER — IBUPROFEN 800 MG PO TABS: 800.0000 mg | ORAL_TABLET | Freq: Three times a day (TID) | ORAL | 0 refills | Status: AC

## 2023-06-11 MED ORDER — KETOROLAC TROMETHAMINE 30 MG/ML IJ SOLN
30.0000 mg | Freq: Once | INTRAMUSCULAR | Status: AC
  Administered 2023-06-11: 30 mg via INTRAMUSCULAR
  Filled 2023-06-11: qty 1

## 2023-06-11 MED ORDER — ACETAMINOPHEN 500 MG PO TABS
1000.0000 mg | ORAL_TABLET | Freq: Once | ORAL | Status: AC
  Administered 2023-06-11: 1000 mg via ORAL
  Filled 2023-06-11: qty 2
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
## 2023-12-09 ENCOUNTER — Ambulatory Visit
Admission: EM | Admit: 2023-12-09 | Discharge: 2023-12-09 | Disposition: A | Discharge status: Home / Self Care | Payer: Self-pay | Attending: Emergency Medicine | Admitting: Emergency Medicine

## 2023-12-09 DIAGNOSIS — S0181XA Laceration without foreign body of other part of head, initial encounter: Secondary | ICD-10-CM

## 2023-12-09 MED ORDER — CEPHALEXIN 500 MG PO CAPS
500.0000 mg | ORAL_CAPSULE | Freq: Three times a day (TID) | ORAL | 0 refills | Status: AC
Start: 2023-12-09 — End: ?

## 2023-12-09 NOTE — Discharge Instructions (Addendum)
 Your stitches need to be removed in 7 days.    Take the antibiotic as directed.    Keep your wound clean and dry.  Wash it gently twice a day with soap and water.    Return right away if you note signs of infection such as pus, fever, redness.

## 2023-12-09 NOTE — ED Triage Notes (Addendum)
 Patient to Urgent Care with complaints of a facial (right cheek) laceration that occurred this afternoon.   Provided with guaze to control the bleeding. Instructed to continue to hold pressure.   TDAP 01/20/22.

## 2023-12-09 NOTE — ED Provider Notes (Signed)
 [name]    CSN: [id number] Arrival date & time: 12/09/23  1704      History   Chief Complaint Chief Complaint  Patient presents with   Laceration    HPI Kimberl Vig is a 23 y.o. female.  Patient presents with a laceration on her right facial cheek that occurred this afternoon when she accidentally cut her face.  Bleeding controlled with direct pressure.  No numbness or weakness.  Last tetanus 2023.  The history is provided by the patient and medical records.    History reviewed. No pertinent past medical history.  There are no active problems to display for this patient.   Past Surgical History:  Procedure Laterality Date   WISDOM TOOTH EXTRACTION      OB History   No obstetric history on file.      Home Medications    Prior to Admission medications   Medication Sig Start Date End Date Taking? Authorizing Provider  cephALEXin (KEFLEX) 500 MG capsule Take 1 capsule (500 mg total) by mouth 3 (three) times daily. 12/09/23  Yes Mickie Bail, NP  acetaminophen (TYLENOL) 500 MG tablet Take 1 tablet (500 mg total) by mouth every 6 (six) hours as needed. 06/11/23   Fayrene Helper, PA-C  ibuprofen (ADVIL) 800 MG tablet Take 1 tablet (800 mg total) by mouth 3 (three) times daily. 06/11/23   Roemhildt, Lorin T, PA-C  ondansetron (ZOFRAN) 4 MG tablet Take 1 tablet (4 mg total) by mouth every 8 (eight) hours as needed for nausea or vomiting. 06/11/23   Fayrene Helper, PA-C    Family History History reviewed. No pertinent family history.  Social History Social History   Tobacco Use   Smoking status: Former   Smokeless tobacco: Former  Building services engineer status: Every Day  Substance Use Topics   Alcohol use: Yes    Comment: social   Drug use: Never     Allergies   Patient has no known allergies.   Review of Systems Review of Systems  Constitutional:  Negative for chills and fever.  Skin:  Positive for wound. Negative for color change.  Neurological:   Negative for weakness and numbness.     Physical Exam Triage Vital Signs ED Triage Vitals [12/09/23 1736]  Encounter Vitals Group     BP 129/78     Systolic BP Percentile      Diastolic BP Percentile      Pulse Rate 89     Resp 18     Temp 98.2 F (36.8 C)     Temp src      SpO2 98 %     Weight      Height      Head Circumference      Peak Flow      Pain Score      Pain Loc      Pain Education      Exclude from Growth Chart    No data found.  Updated Vital Signs BP 129/78   Pulse 89   Temp 98.2 F (36.8 C)   Resp 18   SpO2 98%   Visual Acuity Right Eye Distance:   Left Eye Distance:   Bilateral Distance:    Right Eye Near:   Left Eye Near:    Bilateral Near:     Physical Exam Constitutional:      General: She is not in acute distress. HENT:     Mouth/Throat:  Mouth: Mucous membranes are moist.  Cardiovascular:     Rate and Rhythm: Normal rate and regular rhythm.  Pulmonary:     Effort: Pulmonary effort is normal. No respiratory distress.  Skin:    General: Skin is warm and dry.     Findings: Lesion present.     Comments: 3 cm linear laceration on right facial cheek.  Bleeding controlled.  Neurological:     General: No focal deficit present.     Mental Status: She is alert.     Sensory: No sensory deficit.     Motor: No weakness.      UC Treatments / Results  Labs (all labs ordered are listed, but only abnormal results are displayed) Labs Reviewed - No data to display  EKG   Radiology No results found.  Procedures Laceration Repair  Date/Time: 12/09/2023 6:02 PM  Performed by: Mickie Bail, NP Authorized by: Mickie Bail, NP   Consent:    Consent obtained:  Verbal   Consent given by:  Patient   Risks discussed:  Infection, pain, poor cosmetic result and poor wound healing Universal protocol:    Procedure explained and questions answered to patient or proxy's satisfaction: yes   Anesthesia:    Anesthesia method:  Local  infiltration   Local anesthetic:  Lidocaine 1% w/o epi Laceration details:    Location:  Face   Face location:  R cheek   Length (cm):  3   Depth (mm):  2 Pre-procedure details:    Preparation:  Patient was prepped and draped in usual sterile fashion Exploration:    Hemostasis achieved with:  Direct pressure   Imaging outcome: foreign body not noted     Wound exploration: wound explored through full range of motion and entire depth of wound visualized   Treatment:    Area cleansed with:  Povidone-iodine   Amount of cleaning:  Standard   Irrigation solution:  Sterile water   Irrigation method:  Syringe   Visualized foreign bodies/material removed: no   Skin repair:    Repair method:  Sutures   Suture size:  5-0   Suture material:  Prolene   Suture technique:  Simple interrupted   Number of sutures:  3 Approximation:    Approximation:  Close Repair type:    Repair type:  Simple Post-procedure details:    Dressing:  Antibiotic ointment and non-adherent dressing   Procedure completion:  Tolerated well, no immediate complications  (including critical care time)  Medications Ordered in UC Medications - No data to display  Initial Impression / Assessment and Plan / UC Course  I have reviewed the triage vital signs and the nursing notes.  Pertinent labs & imaging results that were available during my care of the patient were reviewed by me and considered in my medical decision making (see chart for details).    Laceration of right cheek.  3 sutures.  Tetanus is up-to-date.  Treating prophylactically with cephalexin.  Wound care instructions and signs of infection discussed.  Education provided on laceration care.  Instructed patient to return here in 7 days for suture removal.  Instructed her to return right away if she notes signs of infection.  She agrees to plan of care.  Final Clinical Impressions(s) / UC Diagnoses   Final diagnoses:  Facial laceration, initial encounter      Discharge Instructions      Your stitches need to be removed in 7 days.    Take the antibiotic as  directed.    Keep your wound clean and dry.  Wash it gently twice a day with soap and water.    Return right away if you note signs of infection such as pus, fever, redness.     ED Prescriptions     Medication Sig Dispense Auth. Provider   cephALEXin (KEFLEX) 500 MG capsule Take 1 capsule (500 mg total) by mouth 3 (three) times daily. 28 capsule Mickie Bail, NP      PDMP not reviewed this encounter.   Mickie Bail, NP 12/09/23 [phone]

## 2024-01-30 ENCOUNTER — Other Ambulatory Visit: Payer: Self-pay

## 2024-01-30 ENCOUNTER — Emergency Department (HOSPITAL_COMMUNITY)
Admission: EM | Admit: 2024-01-30 | Discharge: 2024-01-30 | Disposition: A | Discharge status: Home / Self Care | Payer: Self-pay | Attending: Emergency Medicine | Admitting: Emergency Medicine

## 2024-01-30 ENCOUNTER — Encounter (HOSPITAL_COMMUNITY): Payer: Self-pay

## 2024-01-30 DIAGNOSIS — F419 Anxiety disorder, unspecified: Secondary | ICD-10-CM | POA: Insufficient documentation

## 2024-01-30 HISTORY — DX: Anxiety disorder, unspecified: F41.9

## 2024-01-30 HISTORY — DX: Bipolar disorder, unspecified: F31.9

## 2024-01-30 HISTORY — DX: Post-traumatic stress disorder, unspecified: F43.10

## 2024-01-30 NOTE — ED Triage Notes (Signed)
 [facility] reports pt coming Extended stay. Pt having a few stresses in life, states her heart rate was fast. Pt states she is going to jail and on probation. Pt thinks she is having an anxiety attack.

## 2024-01-30 NOTE — ED Provider Notes (Signed)
 [place] EMERGENCY DEPARTMENT AT [facility] Provider Note   CSN: [id number] Arrival date & time: 01/30/24  1417     History  Chief Complaint  Patient presents with   Anxiety    Hannah Wilkinson is a 23 y.o. female presents today for anxiety.  Patient states that she is having stressors in her life and having periodic anxiety attacks.  Patient denies SI/HI/AVH.  Patient reports she is just wanting to find a way to control the symptoms.  Patient denies chest pain, shortness of breath, fever, chills, nausea, vomiting, any other complaints at this time.   Anxiety       Home Medications Prior to Admission medications   Medication Sig Start Date End Date Taking? Authorizing Provider  acetaminophen  (TYLENOL ) 500 MG tablet Take 1 tablet (500 mg total) by mouth every 6 (six) hours as needed. 06/11/23   Debbra Fairy, PA-C  cephALEXin  (KEFLEX ) 500 MG capsule Take 1 capsule (500 mg total) by mouth 3 (three) times daily. 12/09/23   Wellington Half, NP  ibuprofen  (ADVIL ) 800 MG tablet Take 1 tablet (800 mg total) by mouth 3 (three) times daily. 06/11/23   Roemhildt, Lorin T, PA-C  ondansetron  (ZOFRAN ) 4 MG tablet Take 1 tablet (4 mg total) by mouth every 8 (eight) hours as needed for nausea or vomiting. 06/11/23   Debbra Fairy, PA-C      Allergies    Patient has no known allergies.    Review of Systems   Review of Systems  Psychiatric/Behavioral:  The patient is nervous/anxious.     Physical Exam Updated Vital Signs BP 131/85 (BP Location: Left Arm)   Pulse (!) 107   Temp 98.9 F (37.2 C) (Oral)   Resp 16   SpO2 100%  Physical Exam Vitals and nursing note reviewed.  Constitutional:      General: She is not in acute distress.    Appearance: Normal appearance. She is well-developed. She is not ill-appearing, toxic-appearing or diaphoretic.  HENT:     Head: Normocephalic and atraumatic.     Right Ear: External ear normal.     Left Ear: External ear normal.  Eyes:      Conjunctiva/sclera: Conjunctivae normal.  Cardiovascular:     Rate and Rhythm: Normal rate and regular rhythm.     Pulses: Normal pulses.     Heart sounds: Normal heart sounds. No murmur heard. Pulmonary:     Effort: Pulmonary effort is normal. No respiratory distress.     Breath sounds: Normal breath sounds.  Abdominal:     Palpations: Abdomen is soft.     Tenderness: There is no abdominal tenderness.  Musculoskeletal:        General: No swelling.     Cervical back: Neck supple.  Skin:    General: Skin is warm and dry.     Capillary Refill: Capillary refill takes less than 2 seconds.  Neurological:     General: No focal deficit present.     Mental Status: She is alert.  Psychiatric:        Attention and Perception: Attention and perception normal.        Mood and Affect: Mood and affect normal.        Speech: Speech normal.        Behavior: Behavior normal. Behavior is cooperative.        Thought Content: Thought content normal.        Judgment: Judgment normal.     ED Results /  Procedures / Treatments   Labs (all labs ordered are listed, but only abnormal results are displayed) Labs Reviewed - No data to display  EKG None  Radiology No results found.  Procedures Procedures    Medications Ordered in ED Medications - No data to display  ED Course/ Medical Decision Making/ A&P                                 Medical Decision Making  This patient presents to the ED for concern of anxiety, this involves an extensive number of treatment options, and is a complaint that carries with it a high risk of complications and morbidity.  The differential diagnosis includes HI, SI, auditory hallucinations, visual hallucinations   Co morbidities that complicate the patient evaluation  Anxiety and PTSD   Test / Admission - Considered:  Considered for admission or further workup however patient vital signs and physical exam were reassuring.  Given that the patient is  not currently homicidal, suicidal, or hallucinating patient instructed to follow-up with [facility] behavioral health for further evaluation and treatment.  Patient given return precautions.  I feel patient is safe for discharge at this time.        Final Clinical Impression(s) / ED Diagnoses Final diagnoses:  Anxiety    Rx / DC Orders ED Discharge Orders     None         Carie Charity, PA-C 01/30/24 1600    Arvilla Birmingham, MD 01/30/24 2330

## 2024-01-30 NOTE — Discharge Instructions (Addendum)
 Today you were seen for anxiety.  Please go to the [facility] as soon as possible for further evaluation and workup.  Please return to the ED if you have any thoughts of hurting yourself or others or hearing and seeing things other people do not. Thank you for letting us  treat you today. After performing a physical exam, I feel you are safe to go home. Please follow up with your PCP in the next several days and provide them with your records from this visit. Return to the Emergency Room if pain becomes severe or symptoms worsen.

## 2024-01-31 ENCOUNTER — Emergency Department (HOSPITAL_COMMUNITY)
Admission: EM | Admit: 2024-01-31 | Discharge: 2024-01-31 | Disposition: A | Discharge status: Home / Self Care | Payer: Self-pay | Attending: Emergency Medicine | Admitting: Emergency Medicine

## 2024-01-31 ENCOUNTER — Other Ambulatory Visit: Payer: Self-pay

## 2024-01-31 ENCOUNTER — Encounter (HOSPITAL_COMMUNITY): Payer: Self-pay

## 2024-01-31 ENCOUNTER — Emergency Department (HOSPITAL_COMMUNITY): Payer: Self-pay

## 2024-01-31 DIAGNOSIS — R1011 Right upper quadrant pain: Secondary | ICD-10-CM | POA: Insufficient documentation

## 2024-01-31 LAB — COMPREHENSIVE METABOLIC PANEL WITH GFR
ALT: 19 U/L (ref 0–44)
AST: 26 U/L (ref 15–41)
Albumin: 3.6 g/dL (ref 3.5–5.0)
Alkaline Phosphatase: 89 U/L (ref 38–126)
Anion gap: 10 (ref 5–15)
BUN: 10 mg/dL (ref 6–20)
CO2: 25 mmol/L (ref 22–32)
Calcium: 8.5 mg/dL — ABNORMAL LOW (ref 8.9–10.3)
Chloride: 102 mmol/L (ref 98–111)
Creatinine, Ser: 0.97 mg/dL (ref 0.44–1.00)
GFR, Estimated: >60 mL/min (ref >60)
Glucose, Bld: 136 mg/dL — ABNORMAL HIGH (ref 70–99)
Potassium: 3.6 mmol/L (ref 3.5–5.1)
Sodium: 137 mmol/L (ref 135–145)
Total Bilirubin: 0.9 mg/dL (ref 0.0–1.2)
Total Protein: 7.7 g/dL (ref 6.5–8.1)

## 2024-01-31 LAB — CBC WITH DIFFERENTIAL/PLATELET
Abs Immature Granulocytes: 0.06 10*3/uL (ref 0.00–0.07)
Basophils Absolute: 0 10*3/uL (ref 0.0–0.1)
Basophils Relative: 0 %
Eosinophils Absolute: 0 10*3/uL (ref 0.0–0.5)
Eosinophils Relative: 0 %
HCT: 41.7 % (ref 36.0–46.0)
Hemoglobin: 13.4 g/dL (ref 12.0–15.0)
Immature Granulocytes: 1 %
Lymphocytes Relative: 18 %
Lymphs Abs: 2 10*3/uL (ref 0.7–4.0)
MCH: 25.7 pg — ABNORMAL LOW (ref 26.0–34.0)
MCHC: 32.1 g/dL (ref 30.0–36.0)
MCV: 80 fL (ref 80.0–100.0)
Monocytes Absolute: 0.6 10*3/uL (ref 0.1–1.0)
Monocytes Relative: 5 %
Neutro Abs: 8.8 10*3/uL — ABNORMAL HIGH (ref 1.7–7.7)
Neutrophils Relative %: 76 %
Platelets: 259 10*3/uL (ref 150–400)
RBC: 5.21 MIL/uL — ABNORMAL HIGH (ref 3.87–5.11)
RDW: 14.9 % (ref 11.5–15.5)
WBC: 11.6 10*3/uL — ABNORMAL HIGH (ref 4.0–10.5)
nRBC: 0 % (ref 0.0–0.2)

## 2024-01-31 LAB — HCG, SERUM, QUALITATIVE: Preg, Serum: NEGATIVE

## 2024-01-31 LAB — LIPASE, BLOOD: Lipase: 22 U/L (ref 11–51)

## 2024-01-31 MED ORDER — KETOROLAC TROMETHAMINE 30 MG/ML IJ SOLN
30.0000 mg | Freq: Once | INTRAMUSCULAR | Status: AC
  Administered 2024-01-31: 30 mg via INTRAVENOUS
  Filled 2024-01-31: qty 1

## 2024-01-31 MED ORDER — ONDANSETRON HCL 4 MG/2ML IJ SOLN
4.0000 mg | Freq: Once | INTRAMUSCULAR | Status: AC
  Administered 2024-01-31: 4 mg via INTRAVENOUS
  Filled 2024-01-31: qty 2

## 2024-01-31 NOTE — ED Provider Notes (Signed)
 The patient was received in signout from Dr. Monnie Anthony.  She has been reassessed and remains pain-free for multiple hours in the ED.  There were no emergent findings on her workup at this time.  I reexamined her right upper quadrant and abdomen based on the CT findings but I do not see evidence of trauma, nor history of this, no evidence of cellulitis or infection or tenderness.  I suspect this is an incidental finding.  She has not provided a urine sample yet but a very low suspicion for UTI at this point with his clinical presentation.  I think she is stable for discharge.  She is comfortable going home   Hannah Birmingham, MD 01/31/24 1945

## 2024-01-31 NOTE — ED Triage Notes (Signed)
 Pt BIB [facility] from home with c/o lower abdominal pain that began 30 minutes ago. Pt reports had a BM then pain began. Pt reports has been menstruating for 2 weeks and pain radiates to her vagina. VSS per EMS.

## 2024-01-31 NOTE — ED Notes (Signed)
 Pt refused BP prior to discharge

## 2024-01-31 NOTE — ED Provider Notes (Signed)
 [place] EMERGENCY DEPARTMENT AT [facility] Provider Note   CSN: [id number] Arrival date & time: 01/31/24  1309     History  Chief Complaint  Patient presents with   Abdominal Pain    Hannah Wilkinson is a 23 y.o. female.   Abdominal Pain    Patient presents to the ED with complaints of abdominal pain.  Patient states she had sudden onset symptoms about 30 minutes ago.  Patient states pain started after having BM.  The pain primarily in her lower abdomen in the superpubic region.  Patient states she is feeling diaphoretic and nauseous.  She denies any dysuria.  She has been having persistent menstrual bleeding for the last 2 weeks so though it is easing off now.  Home Medications Prior to Admission medications   Medication Sig Start Date End Date Taking? Authorizing Provider  acetaminophen  (TYLENOL ) 500 MG tablet Take 1 tablet (500 mg total) by mouth every 6 (six) hours as needed. 06/11/23   Debbra Fairy, PA-C  cephALEXin  (KEFLEX ) 500 MG capsule Take 1 capsule (500 mg total) by mouth 3 (three) times daily. 12/09/23   Wellington Half, NP  ibuprofen  (ADVIL ) 800 MG tablet Take 1 tablet (800 mg total) by mouth 3 (three) times daily. 06/11/23   Roemhildt, Lorin T, PA-C  ondansetron  (ZOFRAN ) 4 MG tablet Take 1 tablet (4 mg total) by mouth every 8 (eight) hours as needed for nausea or vomiting. 06/11/23   Debbra Fairy, PA-C      Allergies    Patient has no known allergies.    Review of Systems   Review of Systems  Gastrointestinal:  Positive for abdominal pain.    Physical Exam Updated Vital Signs BP (!) 135/101 (BP Location: Left Arm)   Pulse 61   Temp 98 F (36.7 C) (Oral)   Resp 20   Ht 1.626 m (5\' 4" )   Wt 117.9 kg   SpO2 100%   BMI 44.63 kg/m  Physical Exam Vitals and nursing note reviewed.  Constitutional:      General: She is in acute distress.     Appearance: She is well-developed.  HENT:     Head: Normocephalic and atraumatic.     Right Ear: External ear  normal.     Left Ear: External ear normal.  Eyes:     General: No scleral icterus.       Right eye: No discharge.        Left eye: No discharge.     Conjunctiva/sclera: Conjunctivae normal.  Neck:     Trachea: No tracheal deviation.  Cardiovascular:     Rate and Rhythm: Normal rate and regular rhythm.  Pulmonary:     Effort: Pulmonary effort is normal. No respiratory distress.     Breath sounds: Normal breath sounds. No stridor. No wheezing or rales.  Abdominal:     General: Bowel sounds are normal. There is no distension.     Palpations: Abdomen is soft.     Tenderness: There is abdominal tenderness in the right lower quadrant and suprapubic area. There is no guarding or rebound.  Musculoskeletal:        General: No tenderness or deformity.     Cervical back: Neck supple.  Skin:    General: Skin is warm and dry.     Findings: No rash.  Neurological:     General: No focal deficit present.     Mental Status: She is alert.     Cranial Nerves: No  cranial nerve deficit, dysarthria or facial asymmetry.     Sensory: No sensory deficit.     Motor: No abnormal muscle tone or seizure activity.     Coordination: Coordination normal.  Psychiatric:        Mood and Affect: Mood normal.     ED Results / Procedures / Treatments   Labs (all labs ordered are listed, but only abnormal results are displayed) Labs Reviewed  COMPREHENSIVE METABOLIC PANEL WITH GFR - Abnormal; Notable for the following components:      Result Value   Glucose, Bld 136 (*)    Calcium 8.5 (*)    All other components within normal limits  CBC WITH DIFFERENTIAL/PLATELET - Abnormal; Notable for the following components:   WBC 11.6 (*)    RBC 5.21 (*)    MCH 25.7 (*)    Neutro Abs 8.8 (*)    All other components within normal limits  LIPASE, BLOOD  HCG, SERUM, QUALITATIVE    EKG None  Radiology CT RENAL STONE STUDY Result Date: 01/31/2024 CLINICAL DATA:  Acute onset lower abdominal pain EXAM: CT ABDOMEN  AND PELVIS WITHOUT CONTRAST TECHNIQUE: Multidetector CT imaging of the abdomen and pelvis was performed following the standard protocol without IV contrast. RADIATION DOSE REDUCTION: This exam was performed according to the departmental dose-optimization program which includes automated exposure control, adjustment of the mA and/or kV according to patient size and/or use of iterative reconstruction technique. COMPARISON:  CT abdomen and pelvis dated 07/07/2021 FINDINGS: Lower chest: No focal consolidation or pulmonary nodule in the lung bases. No pleural effusion or pneumothorax demonstrated. Partially imaged heart size is normal. Hepatobiliary: No focal hepatic lesions. No intra or extrahepatic biliary ductal dilation. Normal gallbladder. Pancreas: No focal lesions or main ductal dilation. Spleen: Normal in size without focal abnormality. Adrenals/Urinary Tract: No adrenal nodules. No suspicious renal masses by noncontrast technique. No hydronephrosis. Nonobstructing punctate renal stones. No focal bladder wall thickening. Stomach/Bowel: Normal appearance of the stomach. No evidence of bowel wall thickening, distention, or inflammatory changes. Colon is diffusely underdistended. Normal appendix. Vascular/Lymphatic: No significant vascular findings are present. No enlarged abdominal or pelvic lymph nodes. Reproductive: No adnexal masses. Other: Trace pelvic free fluid.  No free air or fluid collection. Musculoskeletal: No acute or abnormal lytic or blastic osseous lesions. Subtle subcutaneous soft tissue stranding overlying the anterior right upper abdominal wall (3:25). IMPRESSION: 1. No acute abdominopelvic findings.  Normal appendix. 2. Nonobstructing punctate renal stones. 3. Subtle subcutaneous soft tissue stranding overlying the anterior right upper abdominal wall. Recommend correlation with physical examination. Electronically Signed   By: Limin  Xu M.D.   On: 01/31/2024 18:29    Procedures Procedures     Medications Ordered in ED Medications  ketorolac  (TORADOL ) 30 MG/ML injection 30 mg (30 mg Intravenous Given 01/31/24 1401)  ondansetron  (ZOFRAN ) injection 4 mg (4 mg Intravenous Given 01/31/24 1400)    ED Course/ Medical Decision Making/ A&P Clinical Course as of 02/01/24 0658  Fri Jan 31, 2024  1704 Pt feeling much better now.  No abd ttp. [JK]    Clinical Course User Index [JK] Trish Furl, MD                                 Medical Decision Making Problems Addressed: Right upper quadrant abdominal pain: acute illness or injury that poses a threat to life or bodily functions  Amount and/or Complexity of Data Reviewed Labs:  ordered. Decision-making details documented in ED Course. Radiology: ordered.  Risk Prescription drug management.   Pt presented with acute onset of cramping sharp pain. Sx imporved after dose of tylenol .  Pt not pregnant.  No hepatitis or pancreatitis. Pain all resolved after dose of toradol .  Abd benign on repeat exam .  No ttp in abdomen.  CT scan pending at time of shift change.  Care turned over to Dr Gordon Latus        Final Clinical Impression(s) / ED Diagnoses Final diagnoses:  Right upper quadrant abdominal pain    Rx / DC Orders ED Discharge Orders     None         Trish Furl, MD 02/01/24 [phone]
